# Patient Record
Sex: Male | Born: 1990 | Race: Black or African American | Hispanic: No | Marital: Single | Smoking: Never smoker
Health system: Southern US, Community
[De-identification: ages and names within clinical notes are randomized; demographics above are authoritative.]

---

## 2011-04-09 ENCOUNTER — Emergency Department (HOSPITAL_COMMUNITY)
Admission: EM | Admit: 2011-04-09 | Discharge: 2011-04-09 | Disposition: A | Discharge status: Home / Self Care | Payer: BC Managed Care – PPO | Attending: Emergency Medicine | Admitting: Emergency Medicine

## 2011-04-09 DIAGNOSIS — W2209XA Striking against other stationary object, initial encounter: Secondary | ICD-10-CM | POA: Insufficient documentation

## 2011-04-09 DIAGNOSIS — S91309A Unspecified open wound, unspecified foot, initial encounter: Secondary | ICD-10-CM | POA: Insufficient documentation

## 2011-04-09 DIAGNOSIS — Y9229 Other specified public building as the place of occurrence of the external cause: Secondary | ICD-10-CM | POA: Insufficient documentation

## 2012-09-12 ENCOUNTER — Encounter (HOSPITAL_COMMUNITY): Payer: Self-pay | Admitting: Emergency Medicine

## 2012-09-12 ENCOUNTER — Emergency Department (HOSPITAL_COMMUNITY)
Admission: EM | Admit: 2012-09-12 | Discharge: 2012-09-12 | Disposition: A | Discharge status: Home / Self Care | Payer: BC Managed Care – PPO | Attending: Emergency Medicine | Admitting: Emergency Medicine

## 2012-09-12 ENCOUNTER — Emergency Department (HOSPITAL_COMMUNITY): Payer: BC Managed Care – PPO

## 2012-09-12 DIAGNOSIS — K529 Noninfective gastroenteritis and colitis, unspecified: Secondary | ICD-10-CM

## 2012-09-12 DIAGNOSIS — IMO0001 Reserved for inherently not codable concepts without codable children: Secondary | ICD-10-CM | POA: Insufficient documentation

## 2012-09-12 DIAGNOSIS — R109 Unspecified abdominal pain: Secondary | ICD-10-CM

## 2012-09-12 DIAGNOSIS — R1084 Generalized abdominal pain: Secondary | ICD-10-CM | POA: Insufficient documentation

## 2012-09-12 DIAGNOSIS — K5289 Other specified noninfective gastroenteritis and colitis: Secondary | ICD-10-CM | POA: Insufficient documentation

## 2012-09-12 DIAGNOSIS — R197 Diarrhea, unspecified: Secondary | ICD-10-CM | POA: Insufficient documentation

## 2012-09-12 DIAGNOSIS — R51 Headache: Secondary | ICD-10-CM | POA: Insufficient documentation

## 2012-09-12 DIAGNOSIS — R509 Fever, unspecified: Secondary | ICD-10-CM | POA: Insufficient documentation

## 2012-09-12 DIAGNOSIS — R112 Nausea with vomiting, unspecified: Secondary | ICD-10-CM | POA: Insufficient documentation

## 2012-09-12 LAB — POCT I-STAT, CHEM 8
BUN: 10 mg/dL (ref 6–23)
Calcium, Ion: 1.15 mmol/L (ref 1.12–1.23)
Chloride: 102 mEq/L (ref 96–112)
Creatinine, Ser: 0.9 mg/dL (ref 0.50–1.35)
Glucose, Bld: 140 mg/dL — ABNORMAL HIGH (ref 70–99)
HCT: 51 % (ref 39.0–52.0)
Hemoglobin: 17.3 g/dL — ABNORMAL HIGH (ref 13.0–17.0)
Potassium: 3.6 mEq/L (ref 3.5–5.1)
Sodium: 141 mEq/L (ref 135–145)
TCO2: 30 mmol/L (ref 0–100)

## 2012-09-12 LAB — URINALYSIS, ROUTINE W REFLEX MICROSCOPIC
Bilirubin Urine: NEGATIVE
Glucose, UA: NEGATIVE mg/dL
Hgb urine dipstick: NEGATIVE
Ketones, ur: >80 mg/dL — AB
Nitrite: NEGATIVE
Protein, ur: 30 mg/dL — AB
Specific Gravity, Urine: 1.029 (ref 1.005–1.030)
Urobilinogen, UA: 0.2 mg/dL (ref 0.0–1.0)
pH: 8.5 — ABNORMAL HIGH (ref 5.0–8.0)

## 2012-09-12 LAB — CBC WITH DIFFERENTIAL/PLATELET
Basophils Absolute: 0 10*3/uL (ref 0.0–0.1)
Basophils Relative: 0 % (ref 0–1)
Eosinophils Absolute: 0 10*3/uL (ref 0.0–0.7)
Eosinophils Relative: 0 % (ref 0–5)
HCT: 45.7 % (ref 39.0–52.0)
Hemoglobin: 15.9 g/dL (ref 13.0–17.0)
Lymphocytes Relative: 4 % — ABNORMAL LOW (ref 12–46)
Lymphs Abs: 0.4 10*3/uL — ABNORMAL LOW (ref 0.7–4.0)
MCH: 28.1 pg (ref 26.0–34.0)
MCHC: 34.8 g/dL (ref 30.0–36.0)
MCV: 80.9 fL (ref 78.0–100.0)
Monocytes Absolute: 0.6 10*3/uL (ref 0.1–1.0)
Monocytes Relative: 6 % (ref 3–12)
Neutro Abs: 10.3 10*3/uL — ABNORMAL HIGH (ref 1.7–7.7)
Neutrophils Relative %: 91 % — ABNORMAL HIGH (ref 43–77)
Platelets: 210 10*3/uL (ref 150–400)
RBC: 5.65 MIL/uL (ref 4.22–5.81)
RDW: 13.4 % (ref 11.5–15.5)
WBC: 11.3 10*3/uL — ABNORMAL HIGH (ref 4.0–10.5)

## 2012-09-12 LAB — URINE MICROSCOPIC-ADD ON

## 2012-09-12 MED ORDER — ONDANSETRON HCL 4 MG/2ML IJ SOLN: 4.0000 mg | Freq: Once | INTRAMUSCULAR | Status: DC

## 2012-09-12 MED ORDER — HYDROMORPHONE HCL PF 1 MG/ML IJ SOLN
1.0000 mg | Freq: Once | INTRAMUSCULAR | Status: AC
  Administered 2012-09-12: 1 mg via INTRAVENOUS
  Filled 2012-09-12: qty 1

## 2012-09-12 MED ORDER — LOPERAMIDE HCL 2 MG PO TABS: 2.0000 mg | ORAL_TABLET | Freq: Four times a day (QID) | ORAL | Status: AC | PRN

## 2012-09-12 MED ORDER — ONDANSETRON HCL 4 MG/2ML IJ SOLN
4.0000 mg | Freq: Once | INTRAMUSCULAR | Status: AC
  Administered 2012-09-12: 4 mg via INTRAVENOUS
  Filled 2012-09-12: qty 2

## 2012-09-12 MED ORDER — PROMETHAZINE HCL 25 MG PO TABS: 25.0000 mg | ORAL_TABLET | Freq: Four times a day (QID) | ORAL | Status: AC | PRN

## 2012-09-12 MED ORDER — SODIUM CHLORIDE 0.9 % IV SOLN: 1000.0000 mL | INTRAVENOUS | Status: DC

## 2012-09-12 MED ORDER — SODIUM CHLORIDE 0.9 % IV BOLUS (SEPSIS): 1000.0000 mL | Freq: Once | INTRAVENOUS | Status: DC

## 2012-09-12 MED ORDER — SODIUM CHLORIDE 0.9 % IV SOLN
1000.0000 mL | Freq: Once | INTRAVENOUS | Status: AC
  Administered 2012-09-12: 1000 mL via INTRAVENOUS

## 2012-09-12 MED ORDER — HYDROCODONE-ACETAMINOPHEN 5-325 MG PO TABS: 1.0000 | ORAL_TABLET | Freq: Four times a day (QID) | ORAL | Status: AC | PRN

## 2012-09-12 MED ORDER — SODIUM CHLORIDE 0.9 % IV SOLN: INTRAVENOUS | Status: DC

## 2012-09-12 MED ORDER — IOHEXOL 300 MG/ML  SOLN
25.0000 mL | INTRAMUSCULAR | Status: AC
  Administered 2012-09-12: 25 mL via ORAL

## 2012-09-12 MED ORDER — IOHEXOL 300 MG/ML  SOLN
100.0000 mL | Freq: Once | INTRAMUSCULAR | Status: AC | PRN
  Administered 2012-09-12: 100 mL via INTRAVENOUS

## 2012-09-12 NOTE — ED Notes (Signed)
No distress noted. Resp symmetrical and unlabored. Skin warm and dry.  Denies pain at the present.  Resp symmetrical and unlabored.  Denies nausea.

## 2012-09-12 NOTE — ED Notes (Signed)
Reports eating [employer] last night around 1130pm. States began feeling ill about 0300am. Pt reports sudden onset of nausea/vomiting/diarrhea at 78am. States continued to vomit up to 15 times and feel bad this AM. C/o generalized abdominal pain, pt restless, nauseated in triage.

## 2012-09-15 NOTE — ED Provider Notes (Signed)
History     CSN: [id number]  Arrival date & time 09/12/12  1033   First MD Initiated Contact with Patient 09/12/12 1113      Chief Complaint  Patient presents with  . Abdominal Pain    (Consider location/radiation/quality/duration/timing/severity/associated sxs/prior treatment) Patient is a 22 y.o. male presenting with abdominal pain. The history is provided by the patient and a parent.  Abdominal Pain Associated symptoms: chills, diarrhea, fever, nausea and vomiting   Associated symptoms: no chest pain, no dysuria, no hematuria and no shortness of breath    Ate at [facility] at 2330, began feeling ill at 0300. On set of vomiting and diarrhea at 0500. Generlized abdominal pain started at about 0300. Vomited about 2 times but frequent diarrhea about 15 times. No blood in either. Abdominal pain is an ache at 6/10 nonradiating.   History reviewed. No pertinent past medical history.  History reviewed. No pertinent past surgical history.  History reviewed. No pertinent family history.  History  Substance Use Topics  . Smoking status: Never Smoker   . Smokeless tobacco: Not on file  . Alcohol Use: Yes      Review of Systems  Constitutional: Positive for fever and chills.  HENT: Negative for congestion.   Eyes: Negative for redness.  Respiratory: Negative for shortness of breath.   Cardiovascular: Negative for chest pain.  Gastrointestinal: Positive for nausea, vomiting, abdominal pain and diarrhea. Negative for blood in stool.  Genitourinary: Negative for dysuria and hematuria.  Musculoskeletal: Positive for myalgias.  Skin: Negative for rash.  Neurological: Positive for headaches.  Hematological: Does not bruise/bleed easily.  Psychiatric/Behavioral: Negative for confusion.    Allergies  Review of patient's allergies indicates no known allergies.  Home Medications   Current Outpatient Rx  Name  Route  Sig  Dispense  Refill  . Multiple Vitamin (MULTIVITAMIN WITH  MINERALS) TABS   Oral   Take 1 tablet by mouth daily.         Marland Kitchen HYDROcodone-acetaminophen (NORCO/VICODIN) 5-325 MG per tablet   Oral   Take 1-2 tablets by mouth every 6 (six) hours as needed for pain.   10 tablet   0   . loperamide (IMODIUM A-D) 2 MG tablet   Oral   Take 1 tablet (2 mg total) by mouth 4 (four) times daily as needed for diarrhea or loose stools.   30 tablet   0   . promethazine (PHENERGAN) 25 MG tablet   Oral   Take 1 tablet (25 mg total) by mouth every 6 (six) hours as needed for nausea.   30 tablet   0     BP 123/77  Pulse 98  Temp(Src) 98.3 F (36.8 C) (Oral)  Resp 16  Ht 6' (1.829 m)  Wt 240 lb (108.863 kg)  BMI 32.54 kg/m2  SpO2 99%  Physical Exam  Nursing note and vitals reviewed. Constitutional: He is oriented to person, place, and time. He appears well-developed and well-nourished.  HENT:  Head: Normocephalic and atraumatic.  Mucous membranes slightly dry  Eyes: Conjunctivae and EOM are normal. Pupils are equal, round, and reactive to light.  Neck: Normal range of motion.  Cardiovascular: Normal rate, regular rhythm and normal heart sounds.   No murmur heard. Pulmonary/Chest: Effort normal and breath sounds normal.  Abdominal: Soft. Bowel sounds are normal. There is tenderness. There is no guarding.  Diffuse mild tenderness  Musculoskeletal: Normal range of motion. He exhibits no edema.  Neurological: He is alert and oriented to  person, place, and time. No cranial nerve deficit. He exhibits normal muscle tone. Coordination normal.  Skin: Skin is warm. No rash noted.    ED Course  Procedures (including critical care time)  Labs Reviewed  CBC WITH DIFFERENTIAL - Abnormal; Notable for the following:    WBC 11.3 (*)    Neutrophils Relative 91 (*)    Neutro Abs 10.3 (*)    Lymphocytes Relative 4 (*)    Lymphs Abs 0.4 (*)    All other components within normal limits  URINALYSIS, ROUTINE W REFLEX MICROSCOPIC - Abnormal; Notable for the  following:    pH 8.5 (*)    Ketones, ur >80 (*)    Protein, ur 30 (*)    Leukocytes, UA TRACE (*)    All other components within normal limits  POCT I-STAT, CHEM 8 - Abnormal; Notable for the following:    Glucose, Bld 140 (*)    Hemoglobin 17.3 (*)    All other components within normal limits  URINE MICROSCOPIC-ADD ON  CBC WITH DIFFERENTIAL   No results found. Results for orders placed during the hospital encounter of 09/12/12  CBC WITH DIFFERENTIAL      Result Value Range   WBC 11.3 (*) 4.0 - 10.5 K/uL   RBC 5.65  4.22 - 5.81 MIL/uL   Hemoglobin 15.9  13.0 - 17.0 g/dL   HCT 16.1  09.6 - 04.5 %   MCV 80.9  78.0 - 100.0 fL   MCH 28.1  26.0 - 34.0 pg   MCHC 34.8  30.0 - 36.0 g/dL   RDW 40.9  81.1 - 91.4 %   Platelets 210  150 - 400 K/uL   Neutrophils Relative 91 (*) 43 - 77 %   Neutro Abs 10.3 (*) 1.7 - 7.7 K/uL   Lymphocytes Relative 4 (*) 12 - 46 %   Lymphs Abs 0.4 (*) 0.7 - 4.0 K/uL   Monocytes Relative 6  3 - 12 %   Monocytes Absolute 0.6  0.1 - 1.0 K/uL   Eosinophils Relative 0  0 - 5 %   Eosinophils Absolute 0.0  0.0 - 0.7 K/uL   Basophils Relative 0  0 - 1 %   Basophils Absolute 0.0  0.0 - 0.1 K/uL  URINALYSIS, ROUTINE W REFLEX MICROSCOPIC      Result Value Range   Color, Urine YELLOW  YELLOW   APPearance CLEAR  CLEAR   Specific Gravity, Urine 1.029  1.005 - 1.030   pH 8.5 (*) 5.0 - 8.0   Glucose, UA NEGATIVE  NEGATIVE mg/dL   Hgb urine dipstick NEGATIVE  NEGATIVE   Bilirubin Urine NEGATIVE  NEGATIVE   Ketones, ur >80 (*) NEGATIVE mg/dL   Protein, ur 30 (*) NEGATIVE mg/dL   Urobilinogen, UA 0.2  0.0 - 1.0 mg/dL   Nitrite NEGATIVE  NEGATIVE   Leukocytes, UA TRACE (*) NEGATIVE  URINE MICROSCOPIC-ADD ON      Result Value Range   WBC, UA 0-2  <3 WBC/hpf   RBC / HPF 3-6  <3 RBC/hpf   Bacteria, UA RARE  RARE   Urine-Other MUCOUS PRESENT    POCT I-STAT, CHEM 8      Result Value Range   Sodium 141  135 - 145 mEq/L   Potassium 3.6  3.5 - 5.1 mEq/L   Chloride  102  96 - 112 mEq/L   BUN 10  6 - 23 mg/dL   Creatinine, Ser 7.82  0.50 - 1.35 mg/dL  Glucose, Bld 140 (*) 70 - 99 mg/dL   Calcium, Ion 1.61  0.96 - 1.23 mmol/L   TCO2 30  0 - 100 mmol/L   Hemoglobin 17.3 (*) 13.0 - 17.0 g/dL   HCT 04.5  40.9 - 81.1 %   Results for orders placed during the hospital encounter of 09/12/12  CBC WITH DIFFERENTIAL      Result Value Range   WBC 11.3 (*) 4.0 - 10.5 K/uL   RBC 5.65  4.22 - 5.81 MIL/uL   Hemoglobin 15.9  13.0 - 17.0 g/dL   HCT 91.4  78.2 - 95.6 %   MCV 80.9  78.0 - 100.0 fL   MCH 28.1  26.0 - 34.0 pg   MCHC 34.8  30.0 - 36.0 g/dL   RDW 21.3  08.6 - 57.8 %   Platelets 210  150 - 400 K/uL   Neutrophils Relative 91 (*) 43 - 77 %   Neutro Abs 10.3 (*) 1.7 - 7.7 K/uL   Lymphocytes Relative 4 (*) 12 - 46 %   Lymphs Abs 0.4 (*) 0.7 - 4.0 K/uL   Monocytes Relative 6  3 - 12 %   Monocytes Absolute 0.6  0.1 - 1.0 K/uL   Eosinophils Relative 0  0 - 5 %   Eosinophils Absolute 0.0  0.0 - 0.7 K/uL   Basophils Relative 0  0 - 1 %   Basophils Absolute 0.0  0.0 - 0.1 K/uL  URINALYSIS, ROUTINE W REFLEX MICROSCOPIC      Result Value Range   Color, Urine YELLOW  YELLOW   APPearance CLEAR  CLEAR   Specific Gravity, Urine 1.029  1.005 - 1.030   pH 8.5 (*) 5.0 - 8.0   Glucose, UA NEGATIVE  NEGATIVE mg/dL   Hgb urine dipstick NEGATIVE  NEGATIVE   Bilirubin Urine NEGATIVE  NEGATIVE   Ketones, ur >80 (*) NEGATIVE mg/dL   Protein, ur 30 (*) NEGATIVE mg/dL   Urobilinogen, UA 0.2  0.0 - 1.0 mg/dL   Nitrite NEGATIVE  NEGATIVE   Leukocytes, UA TRACE (*) NEGATIVE  URINE MICROSCOPIC-ADD ON      Result Value Range   WBC, UA 0-2  <3 WBC/hpf   RBC / HPF 3-6  <3 RBC/hpf   Bacteria, UA RARE  RARE   Urine-Other MUCOUS PRESENT    POCT I-STAT, CHEM 8      Result Value Range   Sodium 141  135 - 145 mEq/L   Potassium 3.6  3.5 - 5.1 mEq/L   Chloride 102  96 - 112 mEq/L   BUN 10  6 - 23 mg/dL   Creatinine, Ser 4.69  0.50 - 1.35 mg/dL   Glucose, Bld 629 (*) 70 -  99 mg/dL   Calcium, Ion 5.28  4.13 - 1.23 mmol/L   TCO2 30  0 - 100 mmol/L   Hemoglobin 17.3 (*) 13.0 - 17.0 g/dL   HCT 24.4  01.0 - 27.2 %   Ct Abdomen Pelvis W Contrast  09/12/2012  *RADIOLOGY REPORT*  Clinical Data: Nausea, vomiting and diarrhea.  CT ABDOMEN AND PELVIS WITH CONTRAST  Technique:  Multidetector CT imaging of the abdomen and pelvis was performed following the standard protocol during bolus administration of intravenous contrast.  Contrast: OMNIPAQUE IOHEXOL 300 MG/ML  SOLN  Comparison: None.  Findings: Minimal atelectasis at the lung bases.  Normal appearing liver, spleen, pancreas, gallbladder, adrenal glands, kidneys, urinary bladder and prostate gland.  No gastrointestinal abnormalities or enlarged lymph nodes.  No evidence of appendicitis.  Clear lung bases.  Normal appearing bones.  IMPRESSION: Normal abdomen and pelvis.   Original Report Authenticated By: Beckie Salts, M.D.       1. Enteritis   2. Abdominal pain       MDM   CT Abdomen done due to significant abdominal pain, although rest of story suggestive if viral gastritis. No significant leukocytosis and normal abdominal CT. Suspect viral related illness, some possibility of food poisoning. No evidence of inflammatory bowel disease, appendicitis or gallbladder disease.   Improved in ED with hydration, pain meds and antiemetics.     Shelda Jakes, MD 09/15/12 [phone]

## 2014-11-04 IMAGING — CT CT ABD-PELV W/ CM
2 of 4 series · 16 of 46 positions shown, 18 images · IV contrast (APPLIED)
Comparison: None.

CLINICAL DATA: Nausea, vomiting and diarrhea.

CT ABDOMEN AND PELVIS WITH CONTRAST
TECHNIQUE: Multidetector CT imaging of the abdomen and pelvis was
performed following the standard protocol during bolus
administration of intravenous contrast.
Contrast: 100mL OMNIPAQUE IOHEXOL 300 MG/ML  SOLN

[Series 2: abd/pelv with 5.0 b31f st · axial · 0.66mm/px · z∈[+918,+1368]mm · 13 of 98 slices shown, 15 images]
[im 4/98  soft-tissue]
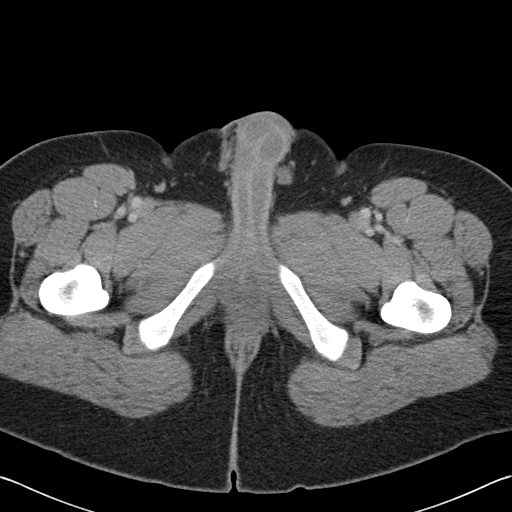
[im 4/98  bone]
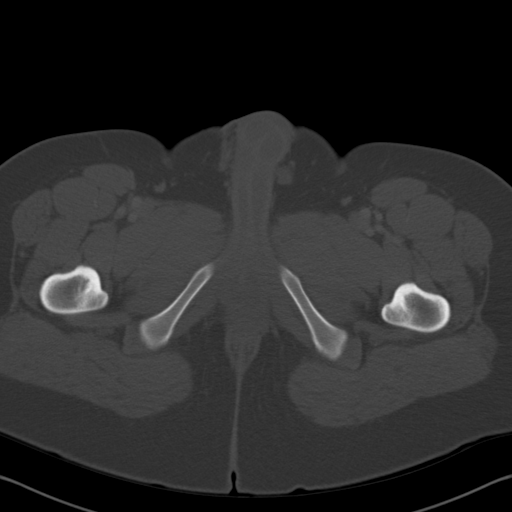
[im 12/98  soft-tissue]
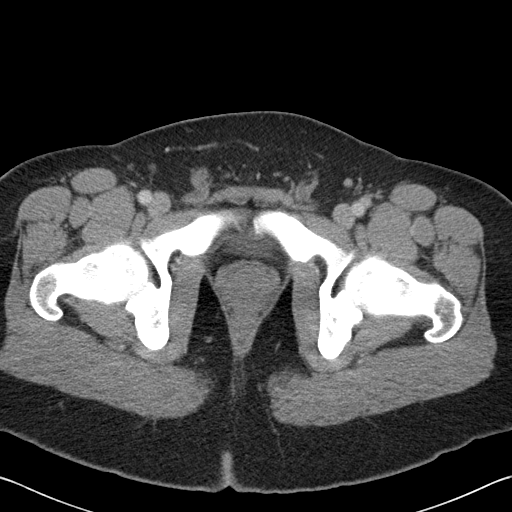
[im 20/98  soft-tissue]
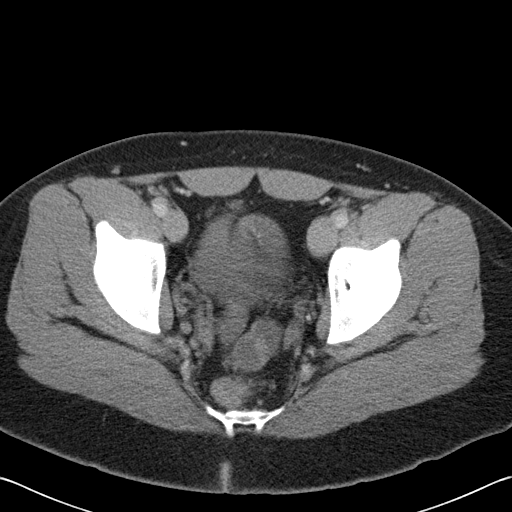
[im 28/98  soft-tissue]
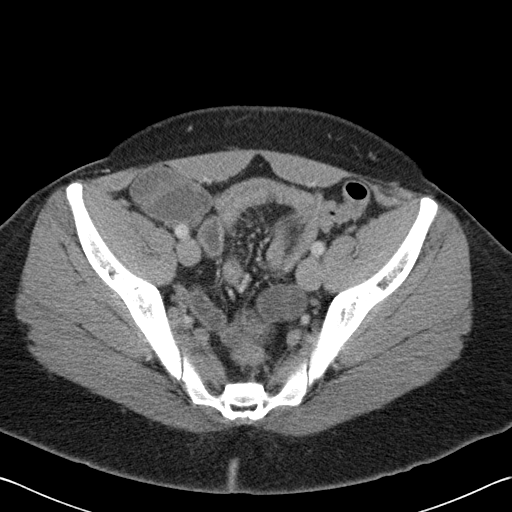
[im 35/98  soft-tissue]
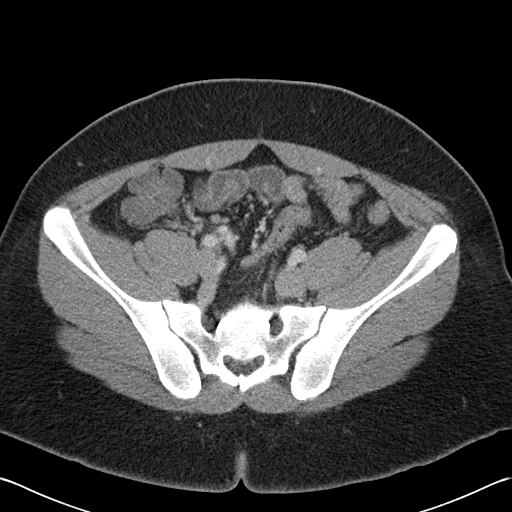
[im 43/98  soft-tissue]
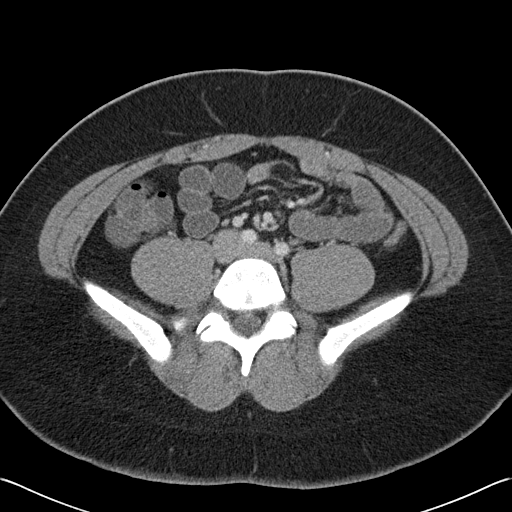
[im 51/98  soft-tissue]
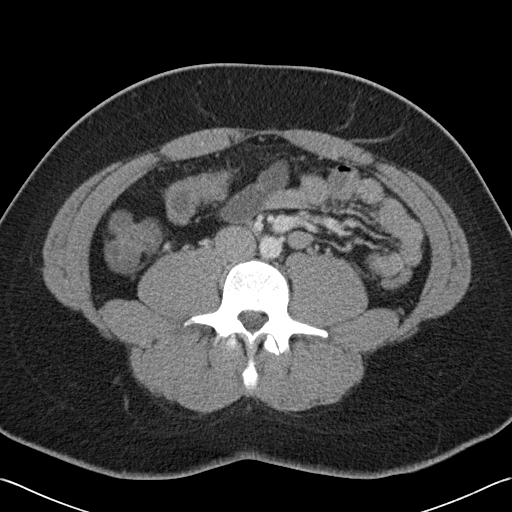
[im 55/98  soft-tissue]
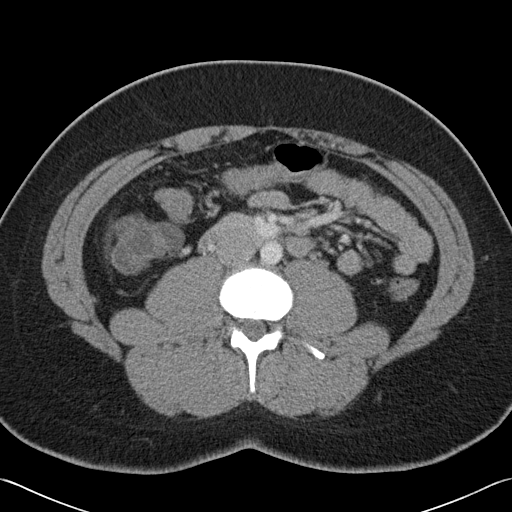
[im 63/98  soft-tissue]
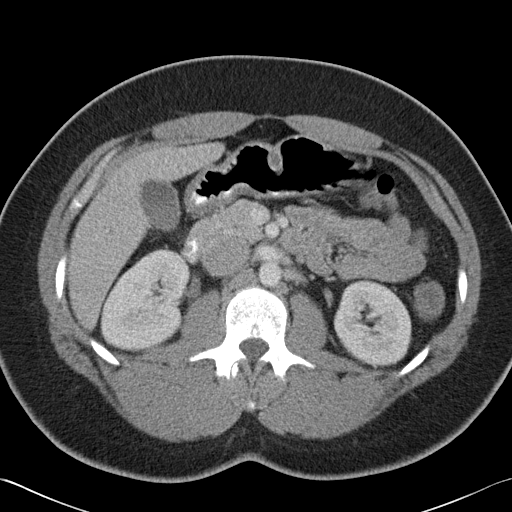
[im 63/98  bone]
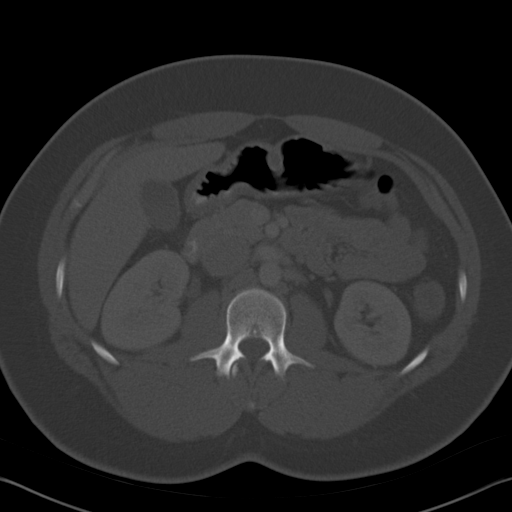
[im 70/98  soft-tissue]
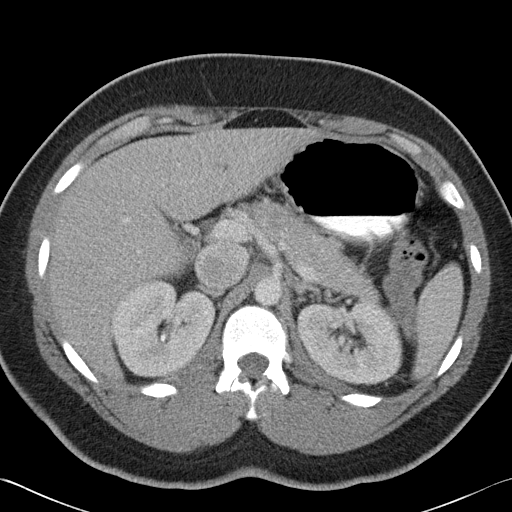
[im 78/98  soft-tissue]
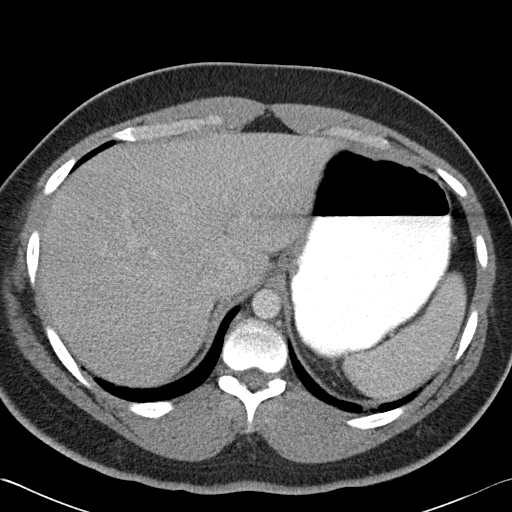
[im 86/98  soft-tissue]
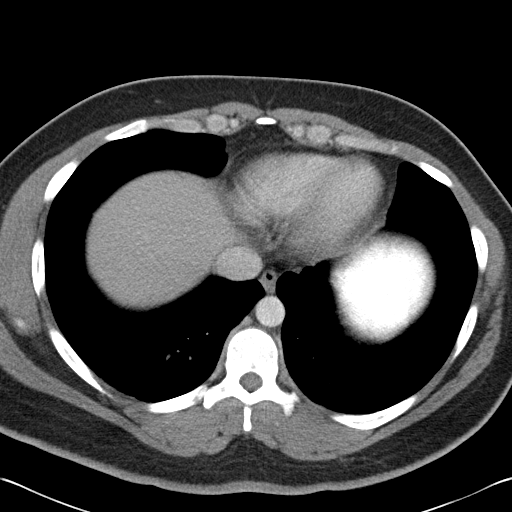
[im 94/98  soft-tissue]
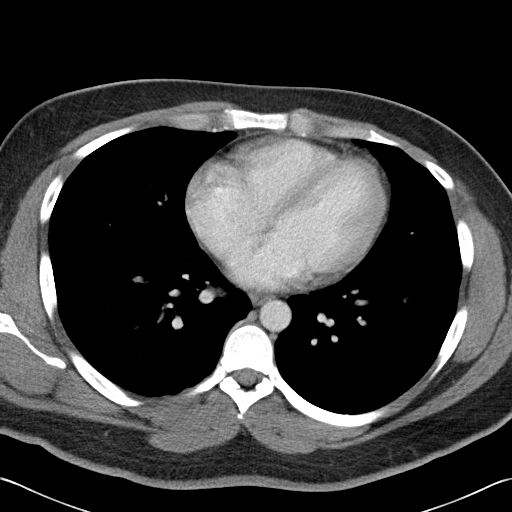

[Series 5: coronals · coronal · 0.62mm/px · 3 of 96 slices shown]
[im 32/96  soft-tissue]
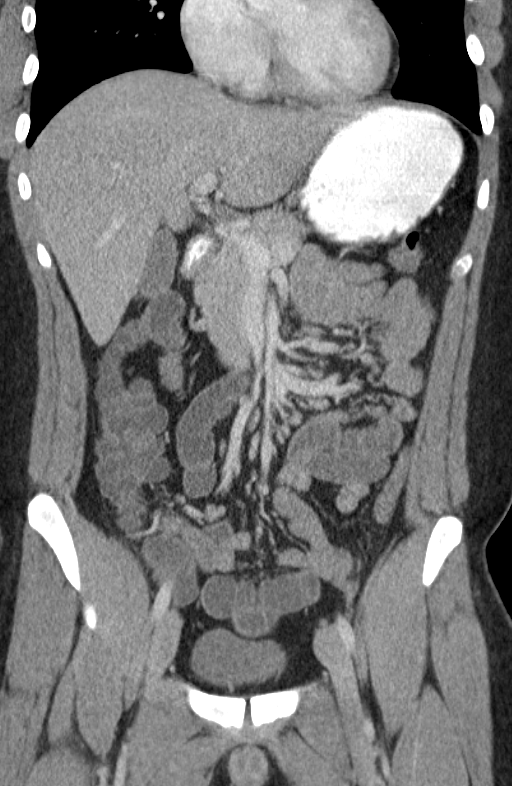
[im 43/96  soft-tissue]
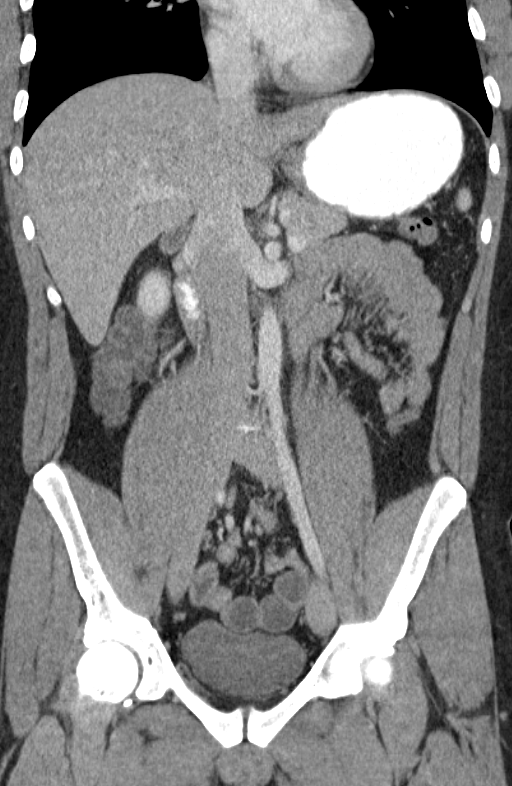
[im 53/96  soft-tissue]
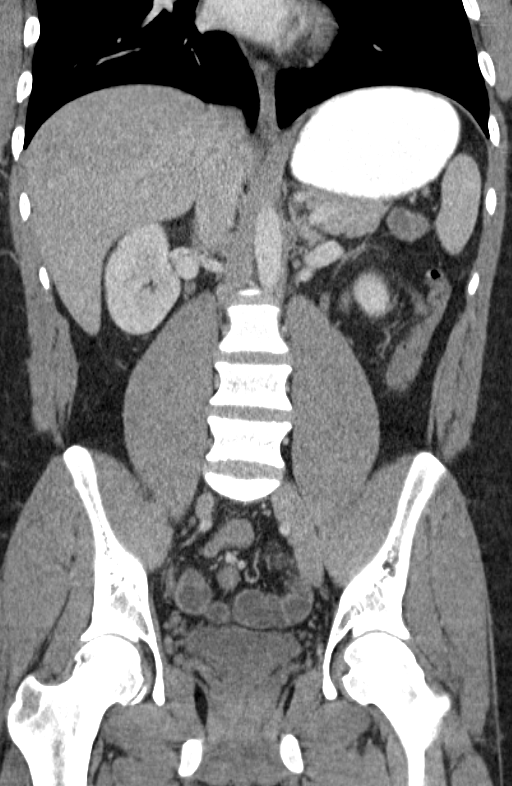

[16 of 46 positions shown; findings below may reference images not displayed]

FINDINGS: Minimal atelectasis at the lung bases.  Normal appearing
liver, spleen, pancreas, gallbladder, adrenal glands, kidneys,
urinary bladder and prostate gland.  No gastrointestinal
abnormalities or enlarged lymph nodes.  No evidence of
appendicitis.  Clear lung bases.  Normal appearing bones.
IMPRESSION: Normal abdomen and pelvis.

## 2019-09-03 ENCOUNTER — Ambulatory Visit: Payer: Self-pay | Attending: Family

## 2019-09-03 DIAGNOSIS — Z23 Encounter for immunization: Secondary | ICD-10-CM

## 2019-09-03 NOTE — Progress Notes (Signed)
   Covid-19 Vaccination Clinic  Name:  [place] Gravlin    MRN: [id number] DOB: Jan 16, 1991  09/03/2019  Mr. Denzer was observed post Covid-19 immunization for 15 minutes without incident. He was provided with Vaccine Information Sheet and instruction to access the V-Safe system.   Mr. Macha was instructed to call 911 with any severe reactions post vaccine: Marland Kitchen Difficulty breathing  . Swelling of face and throat  . A fast heartbeat  . A bad rash all over body  . Dizziness and weakness   Immunizations Administered    Name Date Dose VIS Date Route   Moderna COVID-19 Vaccine 09/03/2019 10:17 AM 0.5 mL 05/19/2019 Intramuscular   Manufacturer: Moderna   Lot: [id number]   NDC: 40370-964-38

## 2019-10-06 ENCOUNTER — Ambulatory Visit: Payer: Self-pay | Attending: Family

## 2019-10-06 DIAGNOSIS — Z23 Encounter for immunization: Secondary | ICD-10-CM

## 2019-10-06 NOTE — Progress Notes (Signed)
   Covid-19 Vaccination Clinic  Name:  [place] Nguyenthi    MRN: [id number] DOB: [birth year]  10/06/2019  Mr. Weakland was observed post Covid-19 immunization for 15 minutes without incident. He was provided with Vaccine Information Sheet and instruction to access the V-Safe system.   Mr. Dunklee was instructed to call 911 with any severe reactions post vaccine: Marland Kitchen Difficulty breathing  . Swelling of face and throat  . A fast heartbeat  . A bad rash all over body  . Dizziness and weakness   Immunizations Administered    Name Date Dose VIS Date Route   Moderna COVID-19 Vaccine 10/06/2019 10:06 AM 0.5 mL 05/2019 Intramuscular   Manufacturer: Moderna   Lot: [id number]   NDC: 79390-300-92

## 2020-03-21 ENCOUNTER — Other Ambulatory Visit: Payer: Self-pay

## 2020-03-21 DIAGNOSIS — Z20822 Contact with and (suspected) exposure to covid-19: Secondary | ICD-10-CM

## 2020-03-22 LAB — NOVEL CORONAVIRUS, NAA: SARS-CoV-2, NAA: NOT DETECTED

## 2020-03-22 LAB — SARS-COV-2, NAA 2 DAY TAT

## 2020-04-16 ENCOUNTER — Encounter (HOSPITAL_COMMUNITY): Payer: Self-pay | Admitting: Emergency Medicine

## 2020-04-16 ENCOUNTER — Other Ambulatory Visit: Payer: Self-pay

## 2020-04-16 ENCOUNTER — Ambulatory Visit (HOSPITAL_COMMUNITY)
Admission: EM | Admit: 2020-04-16 | Discharge: 2020-04-16 | Disposition: A | Discharge status: Home / Self Care | Payer: Self-pay | Attending: Family Medicine | Admitting: Family Medicine

## 2020-04-16 DIAGNOSIS — M25532 Pain in left wrist: Secondary | ICD-10-CM

## 2020-04-16 DIAGNOSIS — S61512A Laceration without foreign body of left wrist, initial encounter: Secondary | ICD-10-CM

## 2020-04-16 MED ORDER — BACITRACIN ZINC 500 UNIT/GM EX OINT
TOPICAL_OINTMENT | CUTANEOUS | Status: AC
  Filled 2020-04-16: qty 0.9

## 2020-04-16 NOTE — Discharge Instructions (Addendum)
Apply neosporin to the area and keep covered during the day as long as the area is still open  Follow up with this office or with primary care for increased swelling, redness, tenderness, warmth, drainage from the area.  Follow up in the ER for red streaking up your arm, high fever, trouble swallowing, trouble breathing, other concerning symptoms.

## 2020-04-16 NOTE — ED Triage Notes (Signed)
Pt presents with laceration that was cut on metal yesterday while moving furniture.

## 2020-04-16 NOTE — ED Provider Notes (Signed)
[facility]    CSN: [id number] Arrival date & time: 04/16/20  1245      History   Chief Complaint Chief Complaint  Patient presents with  . Laceration    HPI David Boone is a 29 y.o. male.   Reports that he was moving a dresser yesterday and was cut on the ulnar aspect of the left wrist. Tetanus is up to date. Reports that he used peroxide to clean the area and dressed the area. Denies severe pain, limited ROM, drainage from the area.   ROS per HPI  The history is provided by the patient.  Laceration   History reviewed. No pertinent past medical history.  There are no problems to display for this patient.   History reviewed. No pertinent surgical history.     Home Medications    Prior to Admission medications   Medication Sig Start Date End Date Taking? Authorizing Provider  HYDROcodone-acetaminophen (NORCO/VICODIN) 5-325 MG per tablet Take 1-2 tablets by mouth every 6 (six) hours as needed for pain. 09/12/12   Vanetta Mulders, MD  loperamide (IMODIUM A-D) 2 MG tablet Take 1 tablet (2 mg total) by mouth 4 (four) times daily as needed for diarrhea or loose stools. 09/12/12   Vanetta Mulders, MD  Multiple Vitamin (MULTIVITAMIN WITH MINERALS) TABS Take 1 tablet by mouth daily.    [provider]  promethazine (PHENERGAN) 25 MG tablet Take 1 tablet (25 mg total) by mouth every 6 (six) hours as needed for nausea. 09/12/12   Vanetta Mulders, MD    Family History History reviewed. No pertinent family history.  Social History Social History   Tobacco Use  . Smoking status: Never Smoker  . Smokeless tobacco: Never Used  Substance Use Topics  . Alcohol use: Yes  . Drug use: No     Allergies   Patient has no known allergies.   Review of Systems Review of Systems   Physical Exam Triage Vital Signs ED Triage Vitals  Enc Vitals Group     BP 04/16/20 1332 139/84     Pulse Rate 04/16/20 1332 97     Resp 04/16/20 1332 17     Temp  04/16/20 1332 98.4 F (36.9 C)     Temp Source 04/16/20 1332 Oral     SpO2 04/16/20 1332 98 %     Weight --      Height --      Head Circumference --      Peak Flow --      Pain Score 04/16/20 1329 1     Pain Loc --      Pain Edu? --      Excl. in GC? --    No data found.  Updated Vital Signs BP 139/84 (BP Location: Right Arm)   Pulse 97   Temp 98.4 F (36.9 C) (Oral)   Resp 17   SpO2 98%     Physical Exam Vitals and nursing note reviewed.  Constitutional:      Appearance: He is well-developed.  HENT:     Head: Normocephalic and atraumatic.  Eyes:     Conjunctiva/sclera: Conjunctivae normal.  Cardiovascular:     Rate and Rhythm: Normal rate and regular rhythm.     Heart sounds: No murmur heard.   Pulmonary:     Effort: Pulmonary effort is normal. No respiratory distress.     Breath sounds: Normal breath sounds.  Abdominal:     Palpations: Abdomen is soft.  Tenderness: There is no abdominal tenderness.  Musculoskeletal:     Cervical back: Neck supple.  Skin:    General: Skin is warm and dry.     Findings: Laceration present.          Comments: Area of 1cm lac  Neurological:     Mental Status: He is alert.      UC Treatments / Results  Labs (all labs ordered are listed, but only abnormal results are displayed) Labs Reviewed - No data to display  EKG   Radiology No results found.  Procedures Procedures (including critical care time)  Medications Ordered in UC Medications - No data to display  Initial Impression / Assessment and Plan / UC Course  I have reviewed the triage vital signs and the nursing notes.  Pertinent labs & imaging results that were available during my care of the patient were reviewed by me and considered in my medical decision making (see chart for details).     Left Wrist Pain Left Wrist Laceration  1 cm lac to L wrist Cleansed with shurclens and dressed with bacitracin, nonadherent dressing, and coban Apply  neosporin to the area and keep clean Not a candidate for sutures as the wound is 24 hours old and is healing already Follow up with this office or with PCP for redness, swelling, heat, drainage to the area  Final Clinical Impressions(s) / UC Diagnoses   Final diagnoses:  Left wrist pain  Laceration of left wrist, initial encounter     Discharge Instructions     Apply neosporin to the area and keep covered during the day as long as the area is still open  Follow up with this office or with primary care for increased swelling, redness, tenderness, warmth, drainage from the area.  Follow up in the ER for red streaking up your arm, high fever, trouble swallowing, trouble breathing, other concerning symptoms.     ED Prescriptions    None     PDMP not reviewed this encounter.   Moshe Cipro, NP 04/16/20 1401

## 2020-05-26 ENCOUNTER — Ambulatory Visit: Payer: Self-pay | Attending: Internal Medicine

## 2020-05-26 DIAGNOSIS — Z23 Encounter for immunization: Secondary | ICD-10-CM

## 2020-05-26 NOTE — Progress Notes (Signed)
   Covid-19 Vaccination Clinic  Name:  David Boone    MRN: [id number] DOB: 05-Jun-1991  05/26/2020  [name] was observed post Covid-19 immunization for 15 minutes without incident. He was provided with Vaccine Information Sheet and instruction to access the V-Safe system.   [name] was instructed to call 911 with any severe reactions post vaccine: Marland Kitchen Difficulty breathing  . Swelling of face and throat  . A fast heartbeat  . A bad rash all over body  . Dizziness and weakness   Immunizations Administered    No immunizations on file.

## 2020-11-12 DIAGNOSIS — S4991XA Unspecified injury of right shoulder and upper arm, initial encounter: Secondary | ICD-10-CM | POA: Diagnosis not present

## 2020-11-12 DIAGNOSIS — S199XXA Unspecified injury of neck, initial encounter: Secondary | ICD-10-CM | POA: Diagnosis not present

## 2020-11-12 DIAGNOSIS — S3993XA Unspecified injury of pelvis, initial encounter: Secondary | ICD-10-CM | POA: Diagnosis not present

## 2020-11-12 DIAGNOSIS — R0689 Other abnormalities of breathing: Secondary | ICD-10-CM | POA: Diagnosis not present

## 2020-11-12 DIAGNOSIS — S299XXA Unspecified injury of thorax, initial encounter: Secondary | ICD-10-CM | POA: Diagnosis not present

## 2020-11-12 DIAGNOSIS — S3992XA Unspecified injury of lower back, initial encounter: Secondary | ICD-10-CM | POA: Diagnosis not present

## 2020-11-12 DIAGNOSIS — Y33XXXA Other specified events, undetermined intent, initial encounter: Secondary | ICD-10-CM | POA: Diagnosis not present

## 2020-11-12 DIAGNOSIS — R519 Headache, unspecified: Secondary | ICD-10-CM | POA: Diagnosis not present

## 2020-11-12 DIAGNOSIS — R402411 Glasgow coma scale score 13-15, in the field [EMT or ambulance]: Secondary | ICD-10-CM | POA: Diagnosis not present

## 2020-11-12 DIAGNOSIS — R0789 Other chest pain: Secondary | ICD-10-CM | POA: Diagnosis not present

## 2020-11-12 DIAGNOSIS — S3991XA Unspecified injury of abdomen, initial encounter: Secondary | ICD-10-CM | POA: Diagnosis not present

## 2020-11-12 DIAGNOSIS — S0990XA Unspecified injury of head, initial encounter: Secondary | ICD-10-CM | POA: Diagnosis not present

## 2020-11-12 DIAGNOSIS — R404 Transient alteration of awareness: Secondary | ICD-10-CM | POA: Diagnosis not present

## 2020-11-12 DIAGNOSIS — R0902 Hypoxemia: Secondary | ICD-10-CM | POA: Diagnosis not present

## 2020-11-12 DIAGNOSIS — Z743 Need for continuous supervision: Secondary | ICD-10-CM | POA: Diagnosis not present

## 2020-11-28 DIAGNOSIS — R69 Illness, unspecified: Secondary | ICD-10-CM | POA: Diagnosis not present

## 2020-12-26 DIAGNOSIS — R509 Fever, unspecified: Secondary | ICD-10-CM | POA: Diagnosis not present

## 2020-12-26 DIAGNOSIS — U071 COVID-19: Secondary | ICD-10-CM | POA: Diagnosis not present

## 2020-12-26 DIAGNOSIS — R059 Cough, unspecified: Secondary | ICD-10-CM | POA: Diagnosis not present

## 2020-12-26 DIAGNOSIS — J029 Acute pharyngitis, unspecified: Secondary | ICD-10-CM | POA: Diagnosis not present

## 2021-01-30 DIAGNOSIS — R69 Illness, unspecified: Secondary | ICD-10-CM | POA: Diagnosis not present

## 2021-02-27 DIAGNOSIS — R69 Illness, unspecified: Secondary | ICD-10-CM | POA: Diagnosis not present

## 2021-03-09 DIAGNOSIS — Z1159 Encounter for screening for other viral diseases: Secondary | ICD-10-CM | POA: Diagnosis not present

## 2021-03-09 DIAGNOSIS — Z23 Encounter for immunization: Secondary | ICD-10-CM | POA: Diagnosis not present

## 2021-03-09 DIAGNOSIS — Z114 Encounter for screening for human immunodeficiency virus [HIV]: Secondary | ICD-10-CM | POA: Diagnosis not present

## 2021-03-09 DIAGNOSIS — Z113 Encounter for screening for infections with a predominantly sexual mode of transmission: Secondary | ICD-10-CM | POA: Diagnosis not present

## 2021-03-09 DIAGNOSIS — Z0000 Encounter for general adult medical examination without abnormal findings: Secondary | ICD-10-CM | POA: Diagnosis not present

## 2021-03-09 DIAGNOSIS — R69 Illness, unspecified: Secondary | ICD-10-CM | POA: Diagnosis not present

## 2021-03-09 DIAGNOSIS — Z7251 High risk heterosexual behavior: Secondary | ICD-10-CM | POA: Diagnosis not present

## 2021-03-09 DIAGNOSIS — Z136 Encounter for screening for cardiovascular disorders: Secondary | ICD-10-CM | POA: Diagnosis not present

## 2021-03-09 DIAGNOSIS — Z79899 Other long term (current) drug therapy: Secondary | ICD-10-CM | POA: Diagnosis not present

## 2021-03-21 DIAGNOSIS — R69 Illness, unspecified: Secondary | ICD-10-CM | POA: Diagnosis not present

## 2021-04-13 DIAGNOSIS — Z20828 Contact with and (suspected) exposure to other viral communicable diseases: Secondary | ICD-10-CM | POA: Diagnosis not present

## 2021-04-13 DIAGNOSIS — Z20822 Contact with and (suspected) exposure to covid-19: Secondary | ICD-10-CM | POA: Diagnosis not present

## 2021-04-26 DIAGNOSIS — R0683 Snoring: Secondary | ICD-10-CM | POA: Diagnosis not present

## 2021-04-26 DIAGNOSIS — Z79899 Other long term (current) drug therapy: Secondary | ICD-10-CM | POA: Diagnosis not present

## 2021-04-26 DIAGNOSIS — R69 Illness, unspecified: Secondary | ICD-10-CM | POA: Diagnosis not present

## 2021-04-26 DIAGNOSIS — Z113 Encounter for screening for infections with a predominantly sexual mode of transmission: Secondary | ICD-10-CM | POA: Diagnosis not present

## 2021-05-16 DIAGNOSIS — R69 Illness, unspecified: Secondary | ICD-10-CM | POA: Diagnosis not present

## 2021-06-14 DIAGNOSIS — Z79899 Other long term (current) drug therapy: Secondary | ICD-10-CM | POA: Diagnosis not present

## 2021-06-14 DIAGNOSIS — R69 Illness, unspecified: Secondary | ICD-10-CM | POA: Diagnosis not present

## 2021-06-14 DIAGNOSIS — Z113 Encounter for screening for infections with a predominantly sexual mode of transmission: Secondary | ICD-10-CM | POA: Diagnosis not present

## 2021-07-07 DIAGNOSIS — G4733 Obstructive sleep apnea (adult) (pediatric): Secondary | ICD-10-CM | POA: Diagnosis not present

## 2021-07-19 DIAGNOSIS — R0981 Nasal congestion: Secondary | ICD-10-CM | POA: Diagnosis not present

## 2021-07-19 DIAGNOSIS — G4733 Obstructive sleep apnea (adult) (pediatric): Secondary | ICD-10-CM | POA: Diagnosis not present

## 2021-08-01 DIAGNOSIS — R69 Illness, unspecified: Secondary | ICD-10-CM | POA: Diagnosis not present
